# Patient Record
Sex: Female | Born: 1985 | Race: Asian | Hispanic: No | Marital: Married
Health system: Southern US, Community
[De-identification: ages and names within clinical notes are randomized; demographics above are authoritative.]

## PROBLEM LIST (undated history)

## (undated) DIAGNOSIS — Z789 Other specified health status: Secondary | ICD-10-CM

## (undated) HISTORY — PX: NO PAST SURGERIES: SHX2092

---

## 2014-06-06 ENCOUNTER — Other Ambulatory Visit (HOSPITAL_COMMUNITY): Payer: Self-pay | Admitting: Obstetrics and Gynecology

## 2014-06-06 DIAGNOSIS — Z3689 Encounter for other specified antenatal screening: Secondary | ICD-10-CM

## 2014-06-06 DIAGNOSIS — Z3A19 19 weeks gestation of pregnancy: Secondary | ICD-10-CM

## 2014-06-06 DIAGNOSIS — O280 Abnormal hematological finding on antenatal screening of mother: Secondary | ICD-10-CM

## 2014-06-14 ENCOUNTER — Ambulatory Visit (HOSPITAL_COMMUNITY)
Admission: RE | Admit: 2014-06-14 | Discharge: 2014-06-14 | Disposition: A | Discharge status: Home / Self Care | Payer: Medicaid Other | Source: Ambulatory Visit | Attending: Obstetrics and Gynecology | Admitting: Obstetrics and Gynecology

## 2014-06-14 ENCOUNTER — Encounter (HOSPITAL_COMMUNITY): Payer: Self-pay

## 2014-06-14 DIAGNOSIS — O99212 Obesity complicating pregnancy, second trimester: Secondary | ICD-10-CM | POA: Insufficient documentation

## 2014-06-14 DIAGNOSIS — O351XX0 Maternal care for (suspected) chromosomal abnormality in fetus, not applicable or unspecified: Secondary | ICD-10-CM | POA: Insufficient documentation

## 2014-06-14 DIAGNOSIS — Z315 Encounter for genetic counseling: Secondary | ICD-10-CM | POA: Diagnosis present

## 2014-06-14 DIAGNOSIS — Z3A19 19 weeks gestation of pregnancy: Secondary | ICD-10-CM

## 2014-06-14 DIAGNOSIS — Z3689 Encounter for other specified antenatal screening: Secondary | ICD-10-CM

## 2014-06-14 DIAGNOSIS — O280 Abnormal hematological finding on antenatal screening of mother: Secondary | ICD-10-CM | POA: Insufficient documentation

## 2014-06-14 HISTORY — DX: Other specified health status: Z78.9

## 2014-06-14 NOTE — Progress Notes (Signed)
Genetic Counseling  High-Risk Gestation Note  Appointment Date:  06/14/2014 Referred By: Peggyann JubaBrooks, Kim C., MD Date of Birth:  [birth year]   Pregnancy History: G1P0 Estimated Date of Delivery: 11/08/14 Estimated Gestational Age: 3637w0d Attending: Particia NearingMartha Decker, MD    Sharon Oconnor was seen for genetic counseling because of an increased risk for fetal Down syndrome based on Quad screening through [facility]. She was accompanied by her mother to today's visit.   She was counseled regarding the Quad screen result and the associated 1 in 40 risk for fetal Down syndrome.  We reviewed chromosomes, nondisjunction, and the common features and variable prognosis of Down syndrome.  In addition, we reviewed the screen adjusted reduction in risks for trisomy 18 and ONTDs.  We also discussed other explanations for a screen positive result including: a gestational dating error, differences in maternal metabolism, and normal variation. They understand that this screening is not diagnostic for Down syndrome but provides a risk assessment.  We reviewed available screening options including noninvasive prenatal screening (NIPS)/cell free DNA (cfDNA) testing and detailed ultrasound.  She was counseled that screening tests are used to modify a patient's a priori risk for aneuploidy, typically based on age. This estimate provides a pregnancy specific risk assessment. We reviewed the benefits and limitations of each option. Specifically, we discussed the conditions for which each test screens, the detection rates, and false positive rates of each. She was also counseled regarding diagnostic testing via amniocentesis. We reviewed the approximate 1 in 300-500 risk for complications for amniocentesis, including spontaneous pregnancy loss. Ms. Delbert PhenixChaudhry reported that [facility] testing (NIPS) was drawn at her OB office last Thursday and results are currently pending. We do not have documentation confirming this report at this time.  She declined amniocentesis.   We reviewed the detailed ultrasound performed today. The ultrasound report will be sent under separate cover. There were no visualized fetal anomalies or markers suggestive of aneuploidy. She understands that screening tests cannot rule out all birth defects or genetic syndromes. The patient was advised of this limitation and states she still does not want additional testing at this time.   Ms. Jamie Brookesshrat Szilagyi was provided with written information regarding cystic fibrosis (CF) including the carrier frequency and incidence in the Asian population, the availability of carrier testing and prenatal diagnosis if indicated.  In addition, we discussed that CF is routinely screened for as part of the [place] newborn screening panel.  She declined testing today.   Both family histories were reviewed and found to be noncontributory for birth defects, intellectual disability, recurrent pregnancy loss, and known genetic conditions. Consanguinity was denied. Without further information regarding the provided family history, an accurate genetic risk cannot be calculated. Further genetic counseling is warranted if more information is obtained.  Ms. Delbert PhenixChaudhry denied exposure to environmental toxins or chemical agents. She denied the use of alcohol, tobacco or street drugs. She denied significant viral illnesses during the course of her pregnancy. Her medical and surgical histories were noncontributory.   I counseled Ms. Bryar Digiulio for approximately 40 minutes regarding the above risks and available options.   Quinn PlowmanKaren Linah Klapper, MS,  Certified Genetic Counselor  06/14/2014

## 2014-06-17 ENCOUNTER — Other Ambulatory Visit (HOSPITAL_COMMUNITY): Payer: Self-pay | Admitting: Obstetrics and Gynecology

## 2015-04-19 ENCOUNTER — Encounter (HOSPITAL_COMMUNITY): Payer: Self-pay | Admitting: *Deleted

## 2015-10-12 IMAGING — US US OB DETAIL+14 WK
1 series · 12 of 28 positions shown · non-contrast
Comparison: none

[Series 1: us ob detail+14 wk · 0.23mm/px · 12 of 87 slices shown]
[im 4/87]
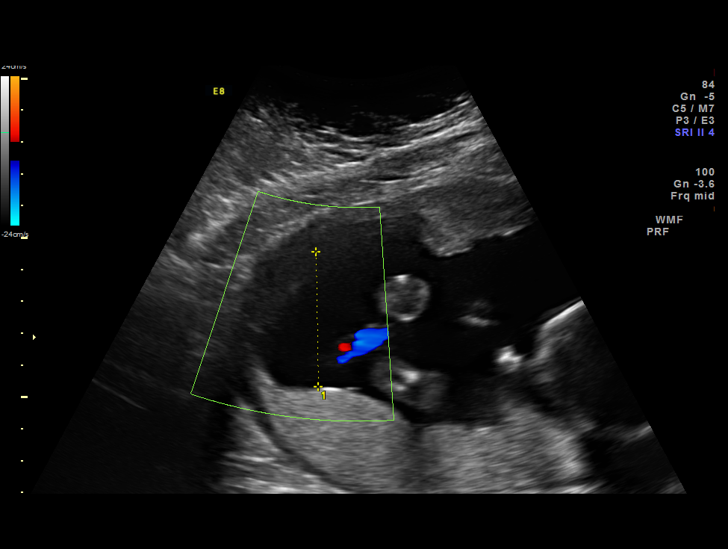
[im 10/87]
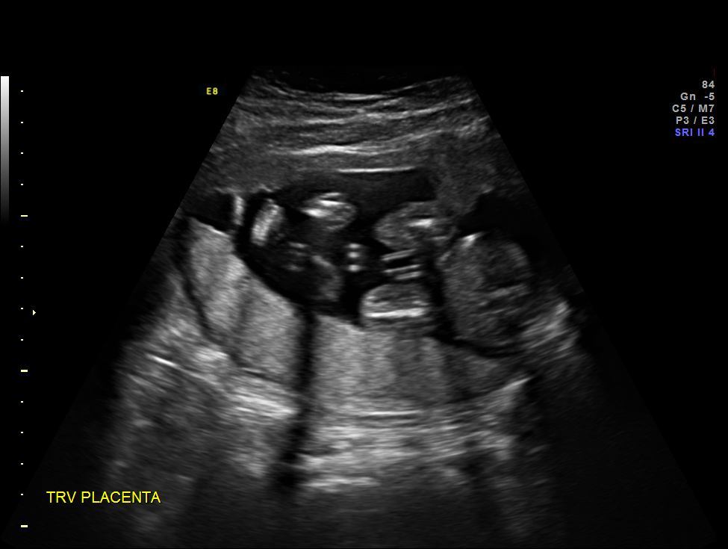
[im 16/87]
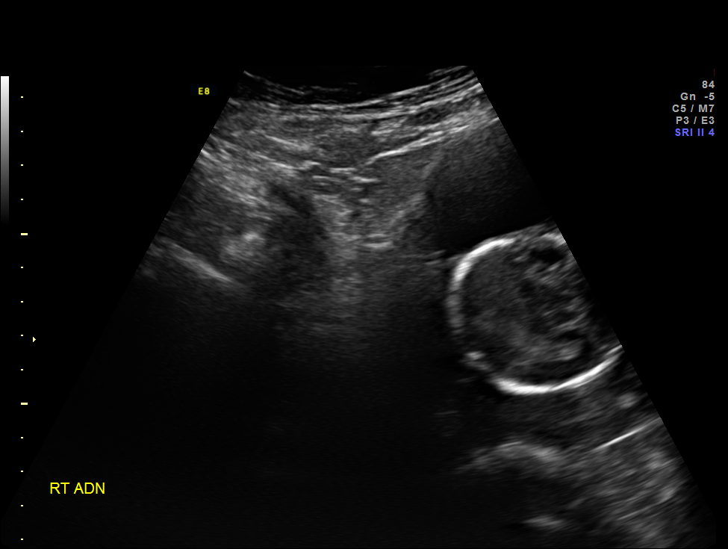
[im 26/87]
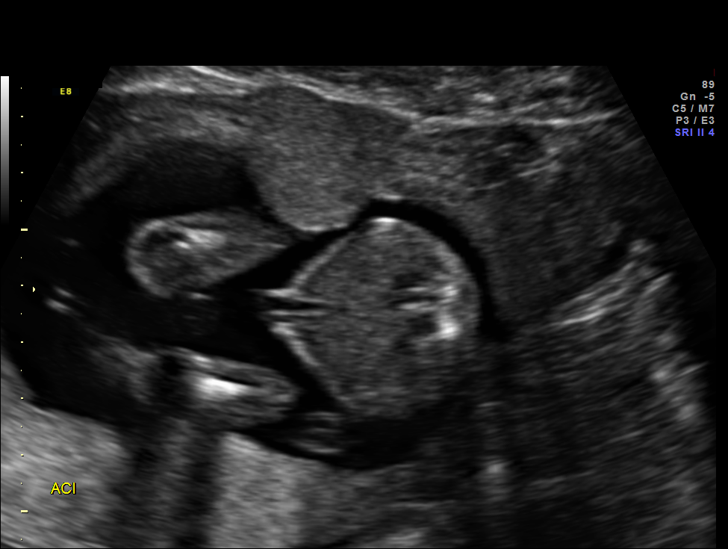
[im 32/87]
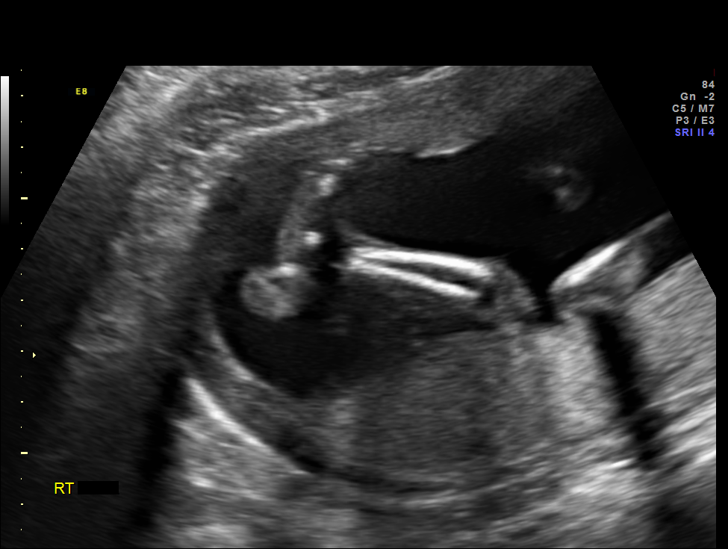
[im 39/87]
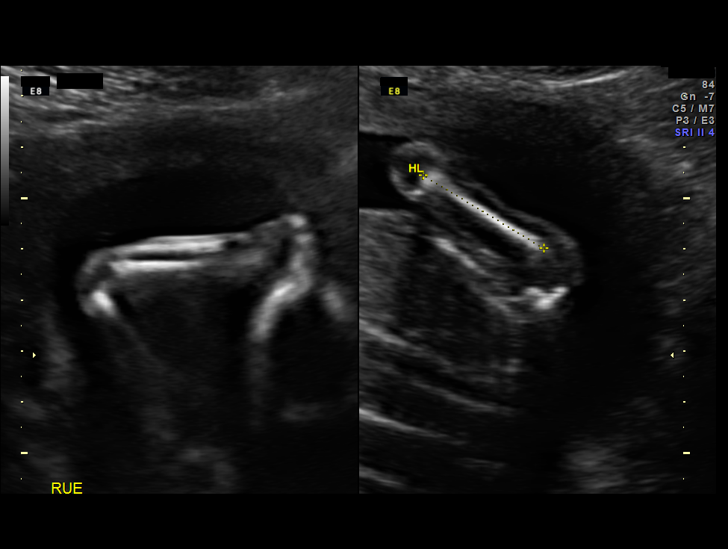
[im 48/87]
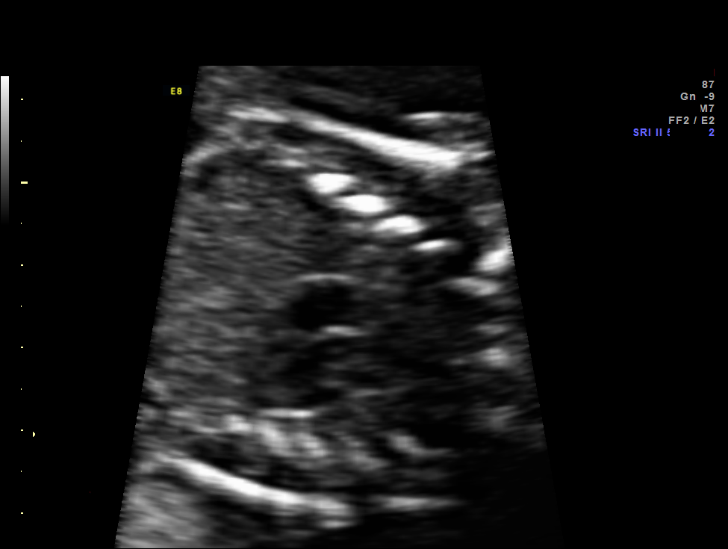
[im 55/87]
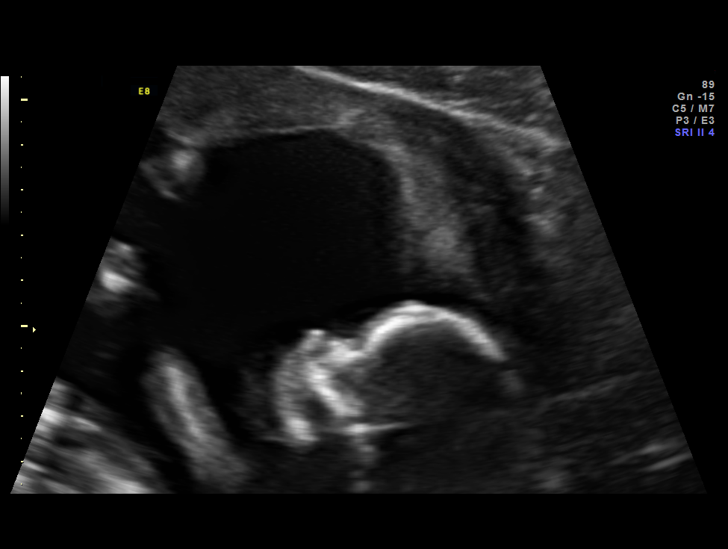
[im 61/87]
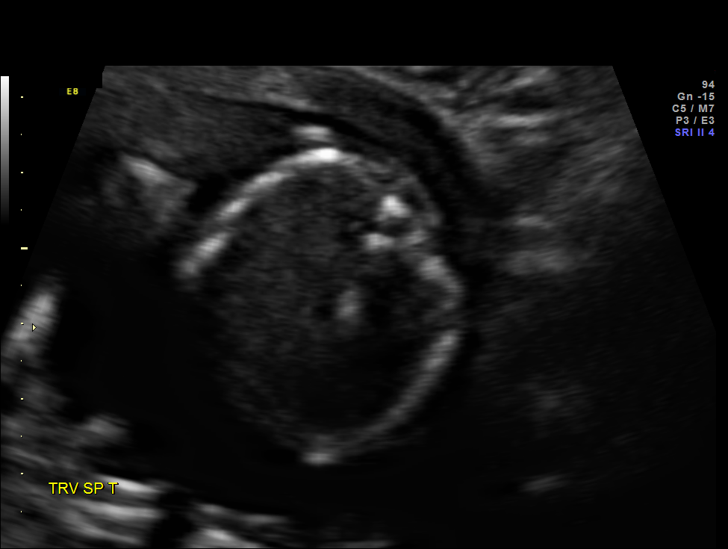
[im 71/87]
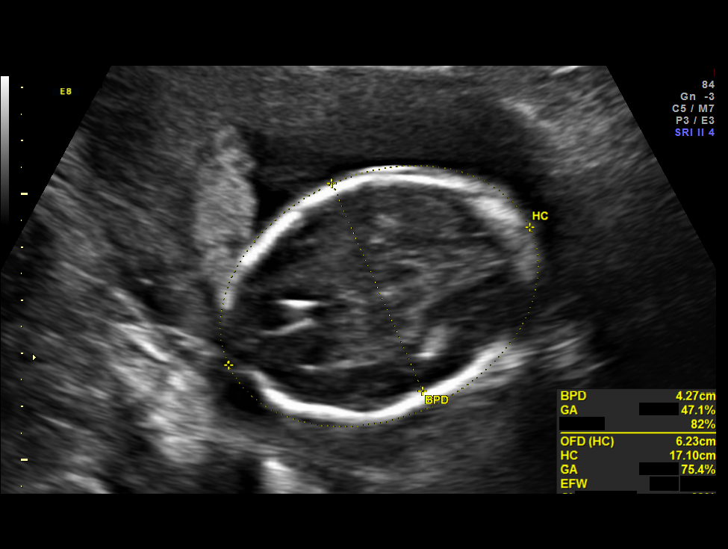
[im 77/87]
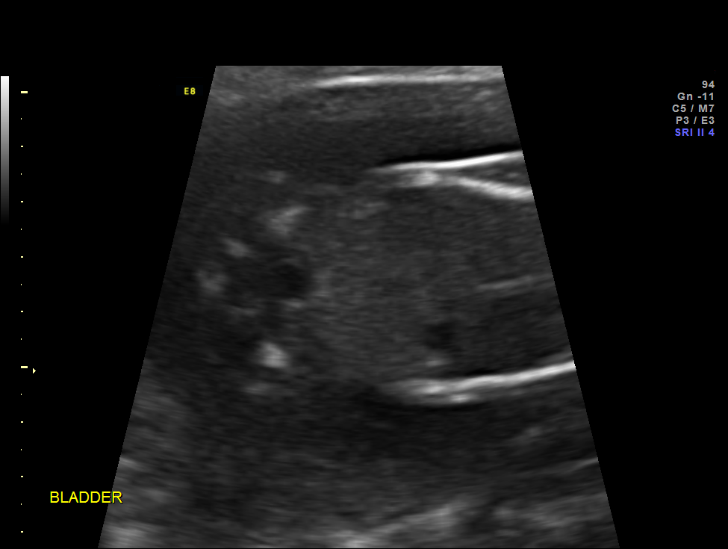
[im 83/87]
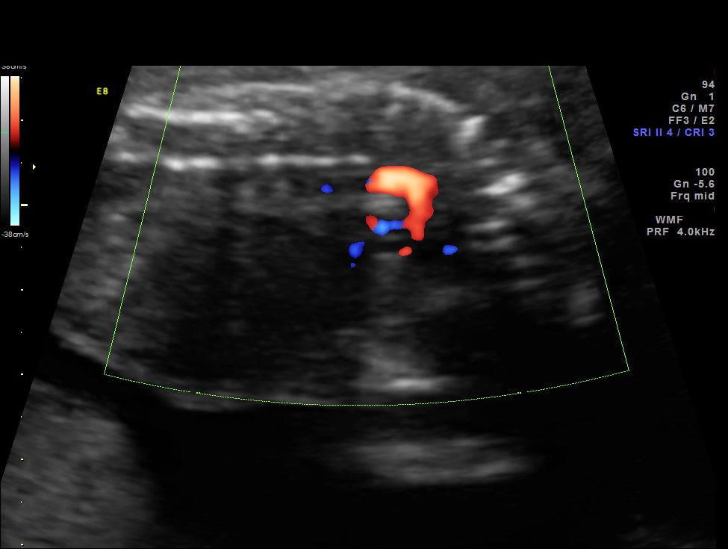

[12 of 28 positions shown; findings below may reference images not displayed]

OBSTETRICS REPORT
                      (Signed Final 06/14/2014 [DATE])

Service(s) Provided

 US OB DETAIL + 14 WK                                  76811.0
Indications

 Detailed fetal anatomic survey                        Z36
 19 weeks gestation of pregnancy
 Abnormal biochemical screen (quad) for Trisomy
 21 ([DATE] DSR)
 Obesity complicating pregnancy
Fetal Evaluation

 Num Of Fetuses:    1
 Fetal Heart Rate:  148                          bpm
 Cardiac Activity:  Observed
 Presentation:      Cephalic
 Placenta:          Posterior, above cervical
                    os
 P. Cord            Marginal insertion
 Insertion:

 Amniotic Fluid
 AFI FV:      Subjectively within normal limits
                                             Larg Pckt:    4.23  cm
Biometry

 BPD:     42.8  mm     G. Age:  19w 0d                CI:         70.6   70 - 86
 OFD:     60.6  mm                                    FL/HC:      18.6   16.1 -

 HC:     168.3  mm     G. Age:  19w 4d       65  %    HC/AC:      1.24   1.09 -

 AC:     135.3  mm     G. Age:  19w 0d       46  %    FL/BPD:
 FL:      31.3  mm     G. Age:  19w 5d       69  %    FL/AC:      23.1   20 - 24
 HUM:     27.8  mm     G. Age:  18w 6d       48  %
 CER:       19  mm     G. Age:  18w 4d       35  %
 NFT:      4.8  mm

 Est. FW:     287  gm    0 lb 10 oz      51  %
Gestational Age

 U/S Today:     19w 2d                                        EDD:   11/06/14
 Best:          19w 0d     Det. By:  Early Ultrasound         EDD:   11/08/14
                                     (04/02/14)
Anatomy

 Cranium:          Appears normal         Aortic Arch:      Appears normal
 Fetal Cavum:      Appears normal         Ductal Arch:      Appears normal
 Ventricles:       Appears normal         Diaphragm:        Appears normal
 Choroid Plexus:   Appears normal         Stomach:          Appears normal, left
                                                            sided
 Cerebellum:       Appears normal         Abdomen:          Appears normal
 Posterior Fossa:  Appears normal         Abdominal Wall:   Appears nml (cord
                                                            insert, abd wall)
 Nuchal Fold:      Appears normal         Cord Vessels:     Appears normal (3
                                                            vessel cord)
 Face:             Orbits nl; profile not Kidneys:          Appear normal
                   well visualized
 Lips:             Appears normal         Bladder:          Appears normal
 Heart:            Appears normal         Spine:            Appears normal
                   (4CH, axis, and
                   situs)
 RVOT:             Appears normal         Lower             Appears normal
                                          Extremities:
 LVOT:             Appears normal         Upper             Appears normal
                                          Extremities:

 Other:  Parents do not wish to know sex of fetus. Fetus appears to be a
         female. Heels visualized. Technically difficult due to maternal habitus
         and fetal position.
Targeted Anatomy

 Fetal Central Nervous System
 Cisterna Magna:
Cervix Uterus Adnexa

 Cervical Length:    3.12     cm

 Cervix:       Normal appearance by transabdominal scan.
 Uterus:       No abnormality visualized.
 Cul De Sac:   No free fluid seen.
 Left Ovary:    Within normal limits.
 Right Ovary:   Not visualized.

 Adnexa:     No abnormality visualized.
Impression

 SIUP at 19+0 weeks
 Normal detailed fetal anatomy; limited views of profile
 Markers of aneuploidy: none
 Normal amniotic fluid volume
 Measurements consistent with prior US

 After genetic counseling, Ms. Kimora decided to await the
 results of the previously drawn cell free DNA screening test.
 She declined amniocentesis.
Recommendations

 Follow-up ultrasounds as clinically indicated.
 Please see genetic counseling note
